# Patient Record
Sex: Female | Born: 1960 | Race: Black or African American | Hispanic: No | Marital: Married | Smoking: Current every day smoker
Health system: Southern US, Community
[De-identification: ages and names within clinical notes are randomized; demographics above are authoritative.]

## PROBLEM LIST (undated history)

## (undated) DIAGNOSIS — E119 Type 2 diabetes mellitus without complications: Secondary | ICD-10-CM

## (undated) DIAGNOSIS — I10 Essential (primary) hypertension: Secondary | ICD-10-CM

---

## 2007-12-28 ENCOUNTER — Emergency Department: Payer: Self-pay | Admitting: Emergency Medicine

## 2008-03-26 ENCOUNTER — Ambulatory Visit: Payer: Self-pay

## 2009-05-19 ENCOUNTER — Ambulatory Visit: Payer: Self-pay | Admitting: Internal Medicine

## 2011-11-16 ENCOUNTER — Ambulatory Visit: Payer: Self-pay | Admitting: Internal Medicine

## 2011-11-30 ENCOUNTER — Ambulatory Visit: Payer: Self-pay | Admitting: Gastroenterology

## 2011-12-29 ENCOUNTER — Ambulatory Visit: Payer: Self-pay | Admitting: Gastroenterology

## 2012-01-03 LAB — PATHOLOGY REPORT

## 2012-11-16 ENCOUNTER — Ambulatory Visit: Payer: Self-pay | Admitting: Internal Medicine

## 2013-11-19 ENCOUNTER — Ambulatory Visit: Payer: Self-pay

## 2014-12-09 ENCOUNTER — Ambulatory Visit: Payer: Self-pay | Admitting: Internal Medicine

## 2015-11-10 ENCOUNTER — Other Ambulatory Visit: Payer: Self-pay | Admitting: Obstetrics and Gynecology

## 2015-11-10 DIAGNOSIS — Z1231 Encounter for screening mammogram for malignant neoplasm of breast: Secondary | ICD-10-CM

## 2015-12-11 ENCOUNTER — Ambulatory Visit
Admission: RE | Admit: 2015-12-11 | Discharge: 2015-12-11 | Disposition: A | Discharge status: Home / Self Care | Source: Ambulatory Visit | Attending: Obstetrics and Gynecology | Admitting: Obstetrics and Gynecology

## 2015-12-11 DIAGNOSIS — Z1231 Encounter for screening mammogram for malignant neoplasm of breast: Secondary | ICD-10-CM | POA: Insufficient documentation

## 2017-01-13 ENCOUNTER — Other Ambulatory Visit: Payer: Self-pay | Admitting: Internal Medicine

## 2017-01-13 DIAGNOSIS — Z1231 Encounter for screening mammogram for malignant neoplasm of breast: Secondary | ICD-10-CM

## 2017-01-16 ENCOUNTER — Ambulatory Visit
Admission: RE | Admit: 2017-01-16 | Discharge: 2017-01-16 | Disposition: A | Discharge status: Home / Self Care | Source: Ambulatory Visit | Attending: Internal Medicine | Admitting: Internal Medicine

## 2017-01-16 DIAGNOSIS — Z1231 Encounter for screening mammogram for malignant neoplasm of breast: Secondary | ICD-10-CM | POA: Insufficient documentation

## 2018-01-01 ENCOUNTER — Other Ambulatory Visit: Payer: Self-pay | Admitting: Obstetrics and Gynecology

## 2018-01-01 DIAGNOSIS — Z1231 Encounter for screening mammogram for malignant neoplasm of breast: Secondary | ICD-10-CM

## 2018-01-18 ENCOUNTER — Ambulatory Visit
Admission: RE | Admit: 2018-01-18 | Discharge: 2018-01-18 | Disposition: A | Discharge status: Home / Self Care | Source: Ambulatory Visit | Attending: Obstetrics and Gynecology | Admitting: Obstetrics and Gynecology

## 2018-01-18 DIAGNOSIS — Z1231 Encounter for screening mammogram for malignant neoplasm of breast: Secondary | ICD-10-CM | POA: Diagnosis not present

## 2019-01-08 ENCOUNTER — Other Ambulatory Visit: Payer: Self-pay | Admitting: Obstetrics and Gynecology

## 2019-01-08 DIAGNOSIS — Z1231 Encounter for screening mammogram for malignant neoplasm of breast: Secondary | ICD-10-CM

## 2019-02-26 ENCOUNTER — Encounter

## 2019-05-01 ENCOUNTER — Other Ambulatory Visit: Payer: Self-pay

## 2019-05-01 ENCOUNTER — Ambulatory Visit
Admission: RE | Admit: 2019-05-01 | Discharge: 2019-05-01 | Disposition: A | Discharge status: Home / Self Care | Source: Ambulatory Visit | Attending: Obstetrics and Gynecology | Admitting: Obstetrics and Gynecology

## 2019-05-01 DIAGNOSIS — Z1231 Encounter for screening mammogram for malignant neoplasm of breast: Secondary | ICD-10-CM | POA: Diagnosis not present

## 2020-01-15 ENCOUNTER — Other Ambulatory Visit: Payer: Self-pay | Admitting: Obstetrics and Gynecology

## 2020-01-15 DIAGNOSIS — Z1231 Encounter for screening mammogram for malignant neoplasm of breast: Secondary | ICD-10-CM

## 2020-04-06 ENCOUNTER — Ambulatory Visit
Admission: RE | Admit: 2020-04-06 | Discharge: 2020-04-06 | Disposition: A | Discharge status: Home / Self Care | Source: Ambulatory Visit | Attending: Obstetrics and Gynecology | Admitting: Obstetrics and Gynecology

## 2020-04-06 DIAGNOSIS — Z1231 Encounter for screening mammogram for malignant neoplasm of breast: Secondary | ICD-10-CM | POA: Insufficient documentation

## 2020-08-08 IMAGING — MG DIGITAL SCREENING BILATERAL MAMMOGRAM WITH TOMO AND CAD
8 of 14 series · 8 of 40 positions shown · non-contrast
Comparison: Previous exam(s).

CLINICAL DATA: Screening.

EXAM:
DIGITAL SCREENING BILATERAL MAMMOGRAM WITH TOMO AND CAD

[R MLO synth-2D (1 of 2)]
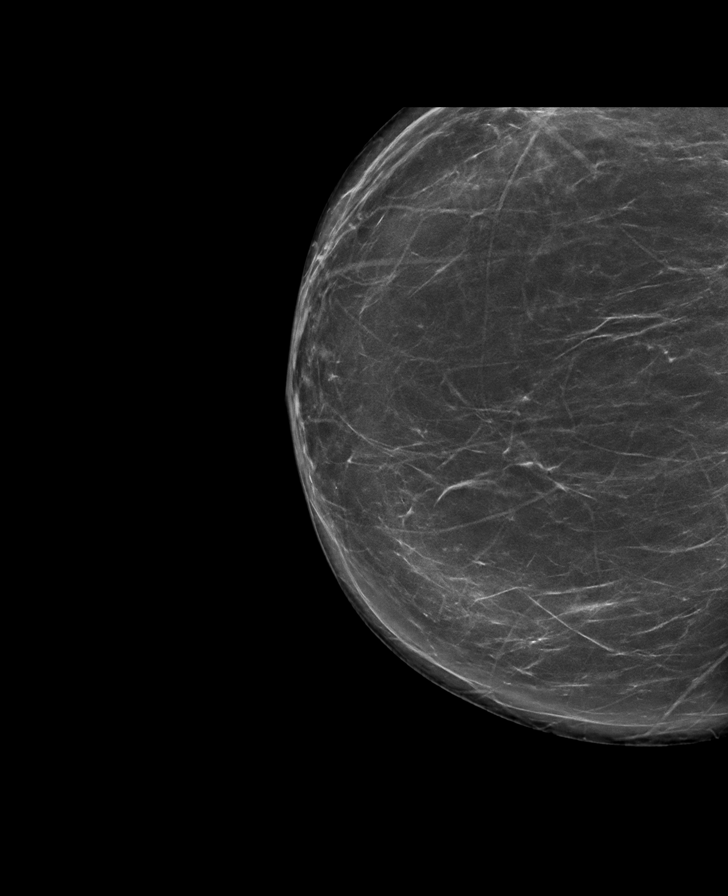

[L MLO synth-2D (1 of 2)]
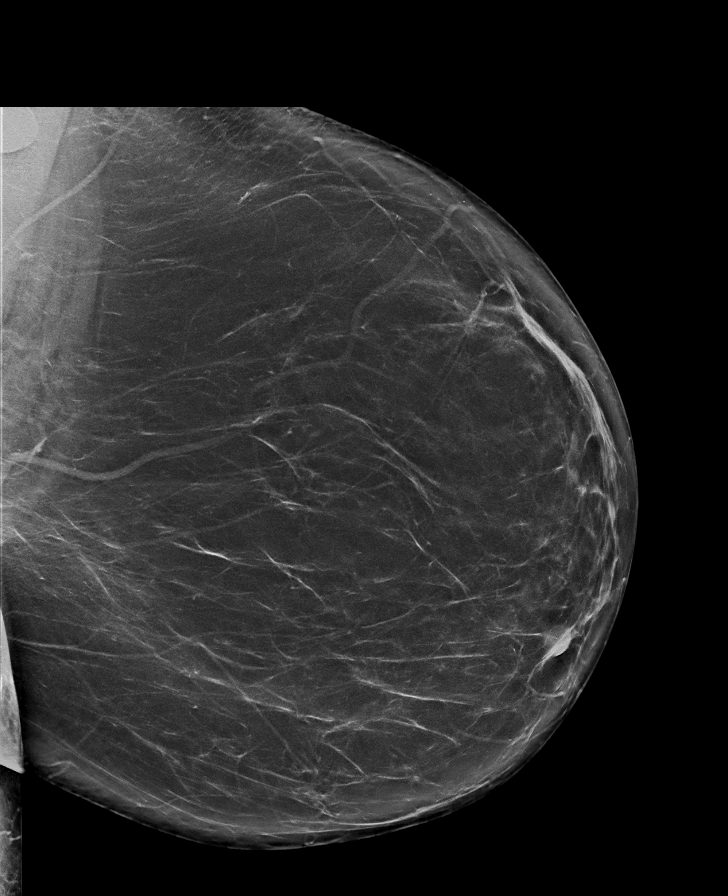

[L CC synth-2D]
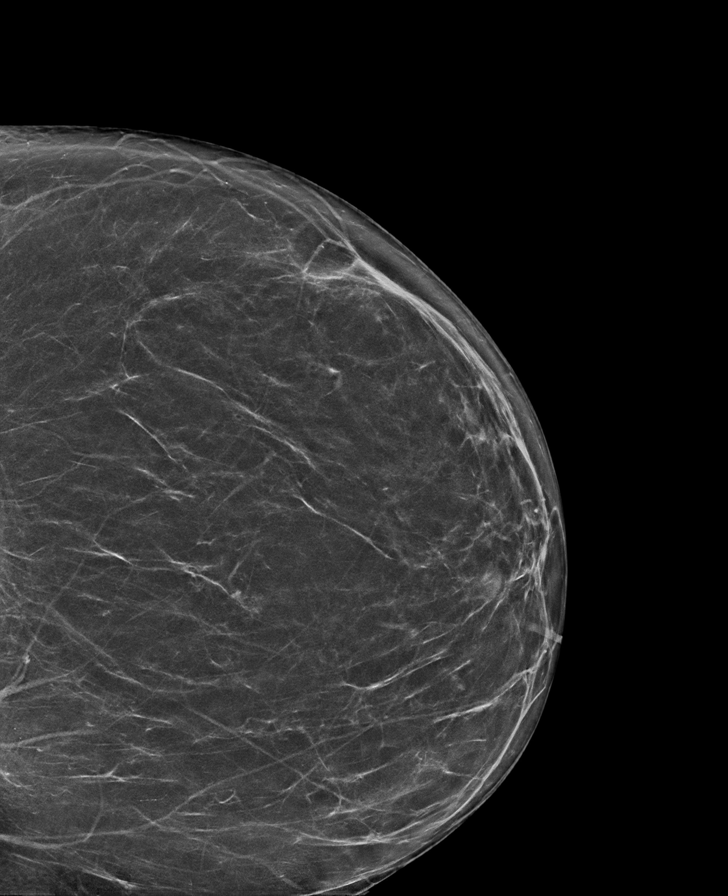

[R CC synth-2D]
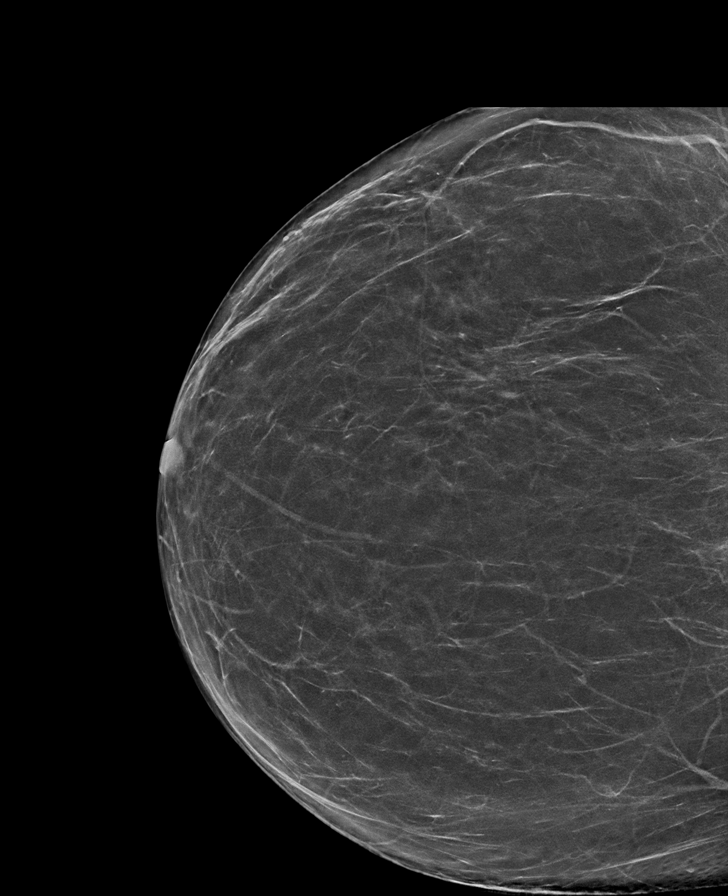

[R CV synth-2D]
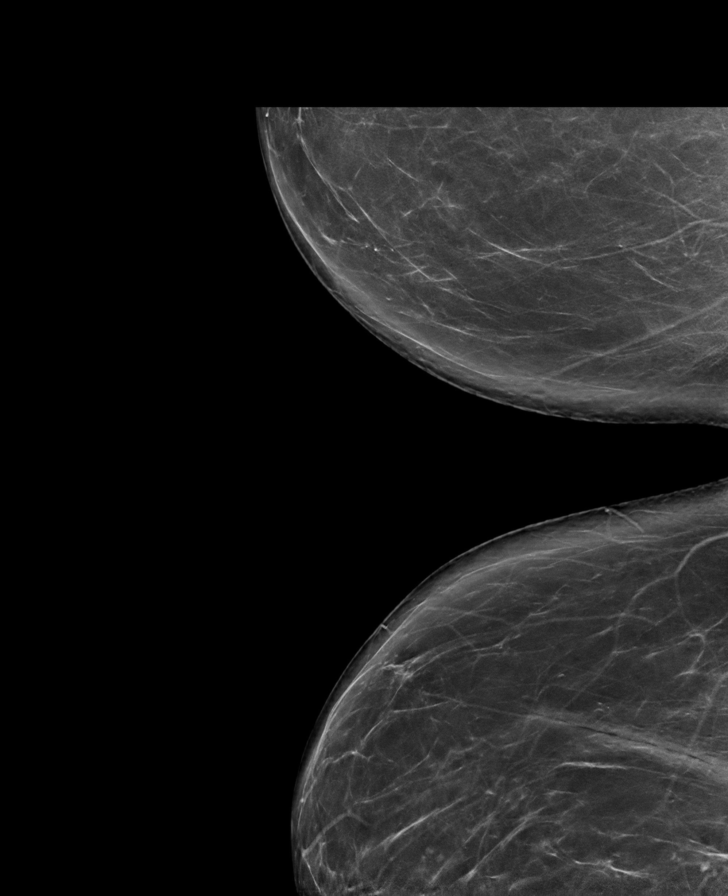

[R MLO synth-2D (2 of 2)]
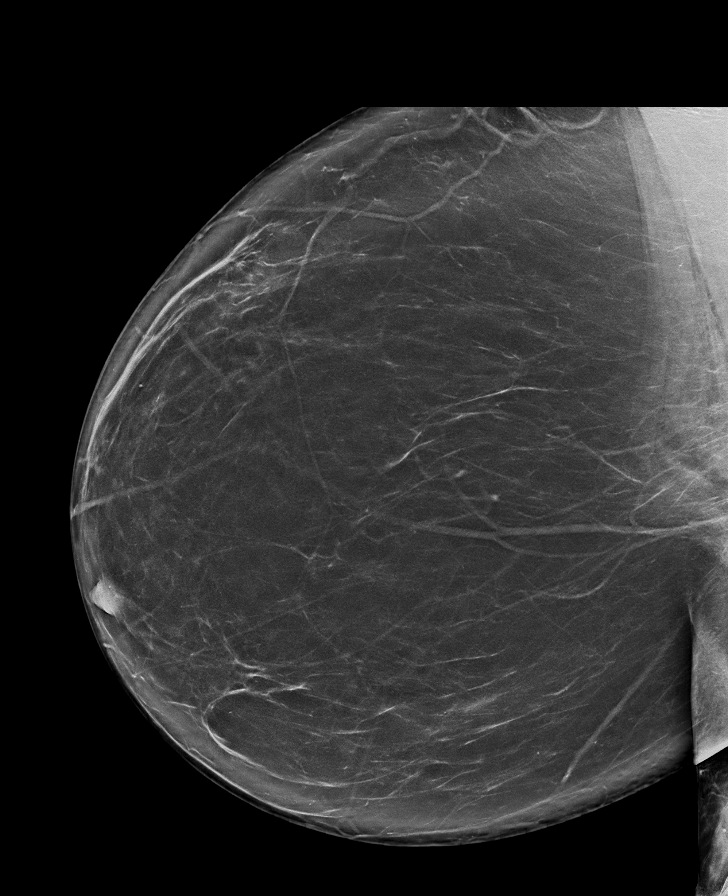

[L MLO synth-2D (2 of 2)]
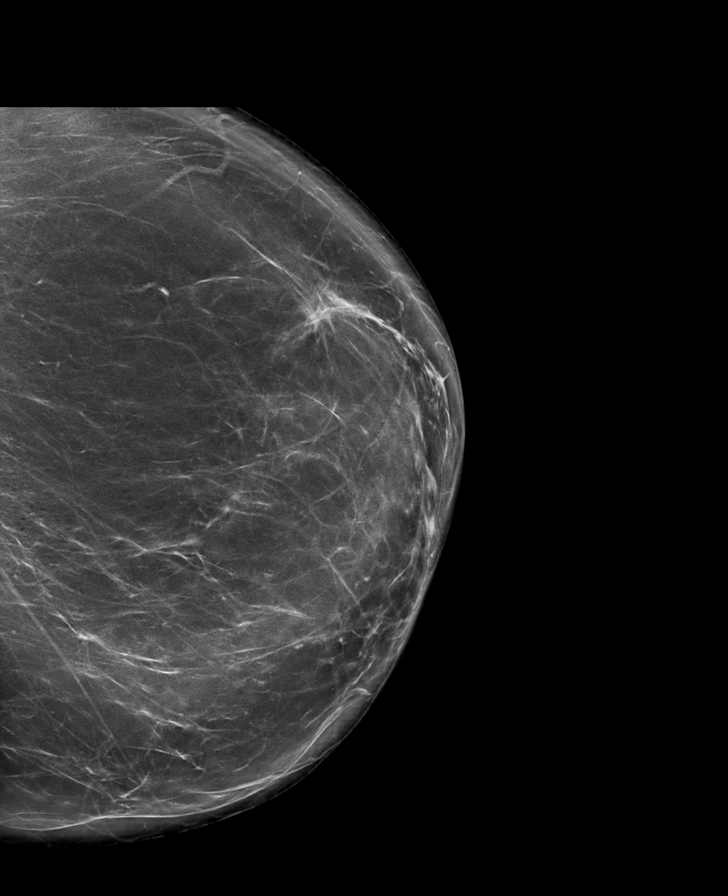

[R MLO tomo · tomo slice 47/94.0]
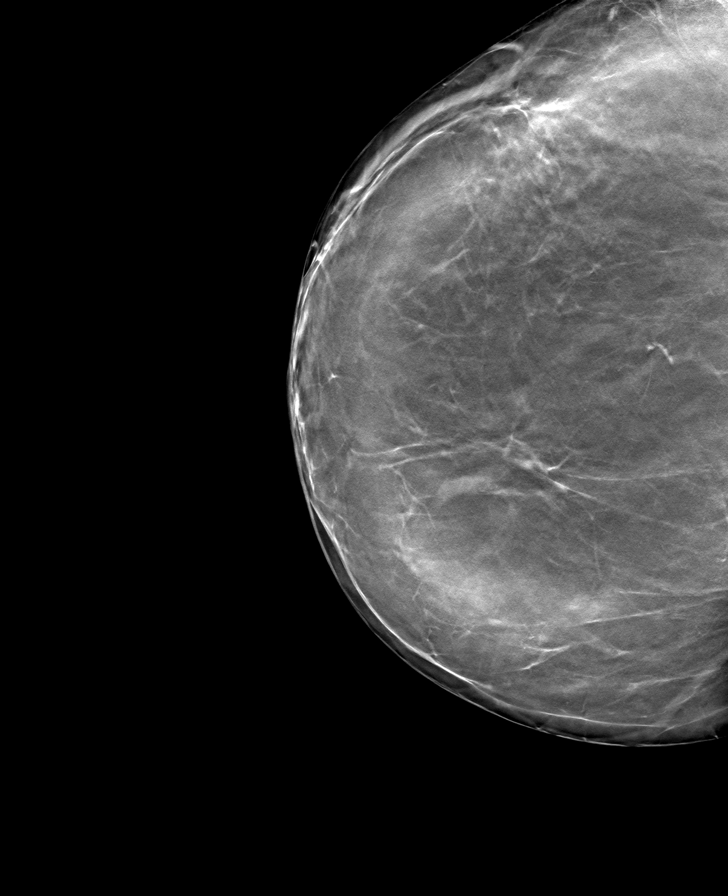

[8 of 40 positions shown; findings below may reference images not displayed]

ACR Breast Density Category b: There are scattered areas of
fibroglandular density.
FINDINGS: There are no findings suspicious for malignancy. Images were
processed with CAD.
IMPRESSION: No mammographic evidence of malignancy. A result letter of this
screening mammogram will be mailed directly to the patient.

RECOMMENDATION:
Screening mammogram in one year. (Code:[id number])

BI-RADS CATEGORY  1: Negative.

## 2021-02-17 ENCOUNTER — Other Ambulatory Visit: Payer: Self-pay | Admitting: Obstetrics and Gynecology

## 2021-02-17 DIAGNOSIS — Z1231 Encounter for screening mammogram for malignant neoplasm of breast: Secondary | ICD-10-CM

## 2021-04-07 ENCOUNTER — Ambulatory Visit
Admission: RE | Admit: 2021-04-07 | Discharge: 2021-04-07 | Disposition: A | Discharge status: Home / Self Care | Source: Ambulatory Visit | Attending: Obstetrics and Gynecology | Admitting: Obstetrics and Gynecology

## 2021-04-07 ENCOUNTER — Other Ambulatory Visit: Payer: Self-pay

## 2021-04-07 DIAGNOSIS — Z1231 Encounter for screening mammogram for malignant neoplasm of breast: Secondary | ICD-10-CM | POA: Insufficient documentation

## 2021-12-03 ENCOUNTER — Emergency Department

## 2021-12-03 ENCOUNTER — Emergency Department
Admission: EM | Admit: 2021-12-03 | Discharge: 2021-12-03 | Disposition: A | Discharge status: Home / Self Care | Attending: Emergency Medicine | Admitting: Emergency Medicine

## 2021-12-03 ENCOUNTER — Other Ambulatory Visit: Payer: Self-pay

## 2021-12-03 ENCOUNTER — Encounter: Payer: Self-pay | Admitting: Emergency Medicine

## 2021-12-03 DIAGNOSIS — M25512 Pain in left shoulder: Secondary | ICD-10-CM | POA: Diagnosis not present

## 2021-12-03 DIAGNOSIS — R0789 Other chest pain: Secondary | ICD-10-CM | POA: Insufficient documentation

## 2021-12-03 DIAGNOSIS — I10 Essential (primary) hypertension: Secondary | ICD-10-CM | POA: Insufficient documentation

## 2021-12-03 DIAGNOSIS — E119 Type 2 diabetes mellitus without complications: Secondary | ICD-10-CM | POA: Diagnosis not present

## 2021-12-03 HISTORY — DX: Essential (primary) hypertension: I10

## 2021-12-03 HISTORY — DX: Type 2 diabetes mellitus without complications: E11.9

## 2021-12-03 LAB — CBC
HCT: 43.9 % (ref 36.0–46.0)
Hemoglobin: 13.7 g/dL (ref 12.0–15.0)
MCH: 28.7 pg (ref 26.0–34.0)
MCHC: 31.2 g/dL (ref 30.0–36.0)
MCV: 91.8 fL (ref 80.0–100.0)
Platelets: 219 10*3/uL (ref 150–400)
RBC: 4.78 MIL/uL (ref 3.87–5.11)
RDW: 13.3 % (ref 11.5–15.5)
WBC: 7.8 10*3/uL (ref 4.0–10.5)
nRBC: 0 % (ref 0.0–0.2)

## 2021-12-03 LAB — BASIC METABOLIC PANEL
Anion gap: 8 (ref 5–15)
BUN: 11 mg/dL (ref 6–20)
CO2: 28 mmol/L (ref 22–32)
Calcium: 10.3 mg/dL (ref 8.9–10.3)
Chloride: 102 mmol/L (ref 98–111)
Creatinine, Ser: 0.76 mg/dL (ref 0.44–1.00)
GFR, Estimated: >60 mL/min (ref >60)
Glucose, Bld: 93 mg/dL (ref 70–99)
Potassium: 3.8 mmol/L (ref 3.5–5.1)
Sodium: 138 mmol/L (ref 135–145)

## 2021-12-03 LAB — TROPONIN I (HIGH SENSITIVITY)
Troponin I (High Sensitivity): 3 ng/L (ref <18)
Troponin I (High Sensitivity): 3 ng/L (ref <18)

## 2021-12-03 MED ORDER — NAPROXEN 500 MG PO TABS: 500.0000 mg | ORAL_TABLET | Freq: Two times a day (BID) | ORAL | 2 refills | Status: AC

## 2021-12-03 NOTE — ED Notes (Signed)
Says left shoulder pain that increases with movement for 2 weeks.  1/10 right now.  Then chest pain on and off that is affected by breathing.  No chest pain right now.

## 2021-12-03 NOTE — ED Triage Notes (Signed)
C/O left arm pain, states shoulder popping with movement, x several weeks.  Also c/o intermitent left chest pain as well.   AAOx3.  Skin warm and dry. NAD

## 2021-12-03 NOTE — ED Provider Notes (Signed)
[facility] Provider Note    Event Date/Time   First MD Initiated Contact with Patient 12/03/21 1320     (approximate)   History   Chest Pain   HPI  Penny Osborne is a 61 y.o. female with a history of diabetes, high blood pressure, obesity who presents with complaints of left anterior chest pain and shoulder pain.  Patient reports this is been ongoing for nearly a month.  Does not be related to when she ranges her left shoulder.  She denies injury to the area.  She does work in [employer]/rest[employer].  No fevers chills or cough.  No shortness of breath.  No leg swelling or pain      Physical Exam   Triage Vital Signs: ED Triage Vitals  Enc Vitals Group     BP 12/03/21 1129 140/88     Pulse Rate 12/03/21 1129 (!) 59     Resp 12/03/21 1129 16     Temp 12/03/21 1129 98.7 F (37.1 C)     Temp Source 12/03/21 1129 Oral     SpO2 12/03/21 1129 97 %     Weight 12/03/21 1127 77.1 kg (170 lb)     Height 12/03/21 1127 1.549 m (5\' 1" )     Head Circumference --      Peak Flow --      Pain Score 12/03/21 1127 3     Pain Loc --      Pain Edu? --      Excl. in GC? --     Most recent vital signs: Vitals:   12/03/21 1129 12/03/21 1339  BP: 140/88 (!) 145/84  Pulse: (!) 59 72  Resp: 16 18  Temp: 98.7 F (37.1 C)   SpO2: 97%      General: Awake, no distress.  CV:  Good peripheral perfusion.  Regular rate and rhythm, no murmurs.  Patient has tenderness palpation along the left anterior chest wall along the pectoralis muscle to the left shoulder where she has point tenderness.  She has pain in that area with range of motion.  No rash Resp:  Normal effort.  CTA B Abd:  No distention.  Other:  No calf pain or swelling   ED Results / Procedures / Treatments   Labs (all labs ordered are listed, but only abnormal results are displayed) Labs Reviewed  BASIC METABOLIC PANEL  CBC  TROPONIN I (HIGH SENSITIVITY)  TROPONIN I (HIGH SENSITIVITY)     EKG  ED ECG  REPORT I, 12/05/21, the attending physician, personally viewed and interpreted this ECG.  Date: 12/03/2021  Rhythm: normal sinus rhythm QRS Axis: normal Intervals: normal ST/T Wave abnormalities: normal Narrative Interpretation: no evidence of acute ischemia    RADIOLOGY Chest x-ray viewed and interpreted by me, no acute abnormality    PROCEDURES:  Critical Care performed:   Procedures   MEDICATIONS ORDERED IN ED: Medications - No data to display   IMPRESSION / MDM / ASSESSMENT AND PLAN / ED COURSE  I reviewed the triage vital signs and the nursing notes.   Patient with a history of diabetes and high blood pressure presents with chest pain.  Given HPI doubt ACS however we are checking high-sensitivity troponins.  Suspect musculoskeletal chest pain  High-sensitivity troponins are negative x2, CBC and BMP are normal.  Chest x-ray is unremarkable, no evidence of pneumothorax or pneumonia  Strongly suspect chest wall musculoskeletal pain.  We will start patient on NSAIDs, have her follow-up with  orthopedics for possible cortisone shot if symptoms do not improve in the next week.  Return precautions discussed.           FINAL CLINICAL IMPRESSION(S) / ED DIAGNOSES   Final diagnoses:  Chest wall pain     Rx / DC Orders   ED Discharge Orders          Ordered    naproxen (NAPROSYN) 500 MG tablet  2 times daily with meals        12/03/21 1330             Note:  This document was prepared using Dragon voice recognition software and may include unintentional dictation errors.   Jene Every, MD 12/03/21 1447

## 2022-04-25 ENCOUNTER — Other Ambulatory Visit: Payer: Self-pay | Admitting: Obstetrics and Gynecology

## 2022-04-26 ENCOUNTER — Other Ambulatory Visit: Payer: Self-pay | Admitting: Internal Medicine

## 2022-04-26 DIAGNOSIS — Z1231 Encounter for screening mammogram for malignant neoplasm of breast: Secondary | ICD-10-CM

## 2022-05-27 ENCOUNTER — Ambulatory Visit
Admission: RE | Admit: 2022-05-27 | Discharge: 2022-05-27 | Disposition: A | Discharge status: Home / Self Care | Source: Ambulatory Visit | Attending: Internal Medicine | Admitting: Internal Medicine

## 2022-05-27 DIAGNOSIS — Z1231 Encounter for screening mammogram for malignant neoplasm of breast: Secondary | ICD-10-CM | POA: Insufficient documentation

## 2023-05-01 ENCOUNTER — Other Ambulatory Visit: Payer: Self-pay | Admitting: Internal Medicine

## 2023-05-01 DIAGNOSIS — Z1231 Encounter for screening mammogram for malignant neoplasm of breast: Secondary | ICD-10-CM

## 2023-05-30 ENCOUNTER — Ambulatory Visit
Admission: RE | Admit: 2023-05-30 | Discharge: 2023-05-30 | Disposition: A | Discharge status: Home / Self Care | Source: Ambulatory Visit | Attending: Internal Medicine | Admitting: Internal Medicine

## 2023-05-30 DIAGNOSIS — Z1231 Encounter for screening mammogram for malignant neoplasm of breast: Secondary | ICD-10-CM | POA: Insufficient documentation

## 2023-12-26 ENCOUNTER — Other Ambulatory Visit: Payer: Self-pay

## 2023-12-26 ENCOUNTER — Emergency Department
Admission: EM | Admit: 2023-12-26 | Discharge: 2023-12-26 | Disposition: A | Discharge status: Home / Self Care | Attending: Emergency Medicine | Admitting: Emergency Medicine

## 2023-12-26 ENCOUNTER — Emergency Department

## 2023-12-26 DIAGNOSIS — I10 Essential (primary) hypertension: Secondary | ICD-10-CM | POA: Insufficient documentation

## 2023-12-26 DIAGNOSIS — R079 Chest pain, unspecified: Secondary | ICD-10-CM

## 2023-12-26 DIAGNOSIS — E119 Type 2 diabetes mellitus without complications: Secondary | ICD-10-CM | POA: Insufficient documentation

## 2023-12-26 DIAGNOSIS — R0789 Other chest pain: Secondary | ICD-10-CM | POA: Diagnosis present

## 2023-12-26 LAB — CBC
HCT: 40.8 % (ref 36.0–46.0)
Hemoglobin: 13.3 g/dL (ref 12.0–15.0)
MCH: 29.7 pg (ref 26.0–34.0)
MCHC: 32.6 g/dL (ref 30.0–36.0)
MCV: 91.1 fL (ref 80.0–100.0)
Platelets: 201 10*3/uL (ref 150–400)
RBC: 4.48 MIL/uL (ref 3.87–5.11)
RDW: 13 % (ref 11.5–15.5)
WBC: 7 10*3/uL (ref 4.0–10.5)
nRBC: 0 % (ref 0.0–0.2)

## 2023-12-26 LAB — BASIC METABOLIC PANEL
Anion gap: 8 (ref 5–15)
BUN: 12 mg/dL (ref 8–23)
CO2: 26 mmol/L (ref 22–32)
Calcium: 9.8 mg/dL (ref 8.9–10.3)
Chloride: 103 mmol/L (ref 98–111)
Creatinine, Ser: 0.77 mg/dL (ref 0.44–1.00)
GFR, Estimated: >60 mL/min (ref >60)
Glucose, Bld: 112 mg/dL — ABNORMAL HIGH (ref 70–99)
Potassium: 3.8 mmol/L (ref 3.5–5.1)
Sodium: 137 mmol/L (ref 135–145)

## 2023-12-26 LAB — TROPONIN I (HIGH SENSITIVITY): Troponin I (High Sensitivity): 4 ng/L (ref <18)

## 2023-12-26 MED ORDER — CYCLOBENZAPRINE HCL 5 MG PO TABS: 5.0000 mg | ORAL_TABLET | Freq: Three times a day (TID) | ORAL | 0 refills | Status: AC | PRN

## 2023-12-26 NOTE — Discharge Instructions (Addendum)
 Please use Tylenol or ibuprofen as needed for discomfort as written on the box.  You may use your prescribed muscle relaxer if needed for discomfort.  Do not drink alcohol or drive while taking this medication.  Please follow-up with your doctor in the next 2 to 3 days for recheck/reevaluation.  Return to the emergency department for any worsening chest pain any shortness of breath or any other symptom personally concerning to yourself.

## 2023-12-26 NOTE — ED Notes (Signed)
 First Nurse Note: Pt to ED via from [facility] for left sided chest pain that started 1 week ago. Pt states that the pain started after doing some activity in PT. Pain states that pain is intermittent.

## 2023-12-26 NOTE — ED Triage Notes (Signed)
 Pt to ED via POV from home. Pt reports left breast pain x1 wk with radiation to back. Pt reports pain has been getting worse. Pt has been exercising at physical therapy. Pt reports pain is relieved when lifting left arm up.

## 2023-12-26 NOTE — ED Notes (Signed)
 See triage notes. Patient c/o left breast pain times one week that radiates to her back. Patient stated the pain has been increasing over the week. Patient reports exercising with physical therapy.

## 2023-12-26 NOTE — ED Provider Notes (Signed)
 [facility] Provider Note    Event Date/Time   First MD Initiated Contact with Patient 12/26/23 1758     (approximate)  History   Chief Complaint: Chest Pain  HPI  Penny Osborne is a 63 y.o. female with a past medical history of hypertension, diabetes, presents to the emergency department for chest pain.  According to the patient last week she was doing physical therapy they had her doing exercises where she was moving both of her arms back-and-forth.  Patient states shortly after the exercise she noticed pain mostly in the left chest/left breast worse with movement of the left upper extremity.  Patient thought this could very likely just be muscular pain from the exercise however it has continued so the patient got worried and came to the emergency department today for evaluation.  Patient denies any nausea denies any diaphoresis or shortness of breath.  No leg pain swelling no history of DVT or PE previously.  No pleuritic pain.  Physical Exam   Triage Vital Signs: ED Triage Vitals [12/26/23 1549]  Encounter Vitals Group     BP 130/80     Systolic BP Percentile      Diastolic BP Percentile      Pulse Rate 83     Resp 20     Temp 98 F (36.7 C)     Temp Source Oral     SpO2 100 %     Weight      Height      Head Circumference      Peak Flow      Pain Score 6     Pain Loc      Pain Education      Exclude from Growth Chart     Most recent vital signs: Vitals:   12/26/23 1549  BP: 130/80  Pulse: 83  Resp: 20  Temp: 98 F (36.7 C)  SpO2: 100%    General: Awake, no distress.  CV:  Good peripheral perfusion.  Regular rate and rhythm  Resp:  Normal effort.  Equal breath sounds bilaterally.  Mild left chest wall tenderness to palpation. Abd:  No distention.  Soft, nontender.  No rebound or guarding. Other:  No lower extremity IMA or tenderness.   ED Results / Procedures / Treatments   EKG  EKG viewed and interpreted by myself shows a  normal sinus rhythm 84 bpm with a narrow QRS, normal axis, normal intervals, no concerning ST changes.  Reassuring EKG.  RADIOLOGY  I have reviewed and interpreted chest x-ray images.  No consolidation on my evaluation however the patient does appear to have some scarring or atelectasis. Radiology has read the x-ray is slight left basilar atelectasis.  No edema no consolidation.   MEDICATIONS ORDERED IN ED: Medications - No data to display   IMPRESSION / MDM / ASSESSMENT AND PLAN / ED COURSE  I reviewed the triage vital signs and the nursing notes.  Patient's presentation is most consistent with acute presentation with potential threat to life or bodily function.  Patient presents to the emergency department for left-sided chest pain since performing physical therapy exercises last week.  Overall the patient appears well, no distress.  Does have mild left chest wall tenderness to palpation.  States most the pain is within the left breast and worse with movement of the left arm, highly suspect more musculoskeletal pain.  Patient is lab work today shows a reassuring CBC, reassuring chemistry, negative troponin.  Chest x-ray  is clear and EKG shows no significant finding.  Given the patient's reassuring workup reassuring physical exam and reassuring vitals I believe the patient is safe for discharge home with outpatient follow-up.  Highly suspect musculoskeletal pain.  Will prescribe a short course of a muscle relaxer for the patient also discussed supportive care at home.  Patient agreeable to plan of care.  Provided my typical chest pain return precautions.  FINAL CLINICAL IMPRESSION(S) / ED DIAGNOSES   Musculoskeletal pain Chest wall pain  Rx / DC Orders   Flexeril  Note:  This document was prepared using Dragon voice recognition software and may include unintentional dictation errors.   Minna Antis, MD 12/26/23 [phone]

## 2024-05-15 ENCOUNTER — Other Ambulatory Visit: Payer: Self-pay | Admitting: Internal Medicine

## 2024-05-15 DIAGNOSIS — Z1231 Encounter for screening mammogram for malignant neoplasm of breast: Secondary | ICD-10-CM

## 2024-05-31 ENCOUNTER — Ambulatory Visit
Admission: RE | Admit: 2024-05-31 | Discharge: 2024-05-31 | Disposition: A | Discharge status: Home / Self Care | Source: Ambulatory Visit | Attending: Internal Medicine | Admitting: Internal Medicine

## 2024-05-31 DIAGNOSIS — Z1231 Encounter for screening mammogram for malignant neoplasm of breast: Secondary | ICD-10-CM | POA: Diagnosis present
